# Patient Record
Sex: Female | Born: 1945 | Race: Black or African American | Hispanic: No | Smoking: Never smoker
Health system: Southern US, Community
[De-identification: ages and names within clinical notes are randomized; demographics above are authoritative.]

## PROBLEM LIST (undated history)

## (undated) ENCOUNTER — Emergency Department: Admission: EM | Payer: Medicare Other | Source: Home / Self Care

## (undated) DIAGNOSIS — I10 Essential (primary) hypertension: Secondary | ICD-10-CM

## (undated) HISTORY — PX: ABDOMINAL HYSTERECTOMY: SHX81

---

## 2008-01-23 ENCOUNTER — Ambulatory Visit: Payer: Self-pay

## 2010-08-20 IMAGING — CR DG KNEE COMPLETE 4+V*L*
1 series · 4 of 4 positions shown · non-contrast
Comparison: none

REASON FOR EXAM: Contusion CALL RESULTS STAT TO [phone]
COMMENTS:

PROCEDURE:     DXR - DXR KNEE LT COMP WITH OBLIQUES  - January 23, 2008  [DATE]
RESULT:     No fracture or dislocation is seen. Degenerative spurring is
noted at the knee medially and laterally. The knee joint space is
symmetrical. The patella is intact.

[Series 1: view not recorded · 0.17mm/px · 4 of 4 slices shown]
[im 1/4]
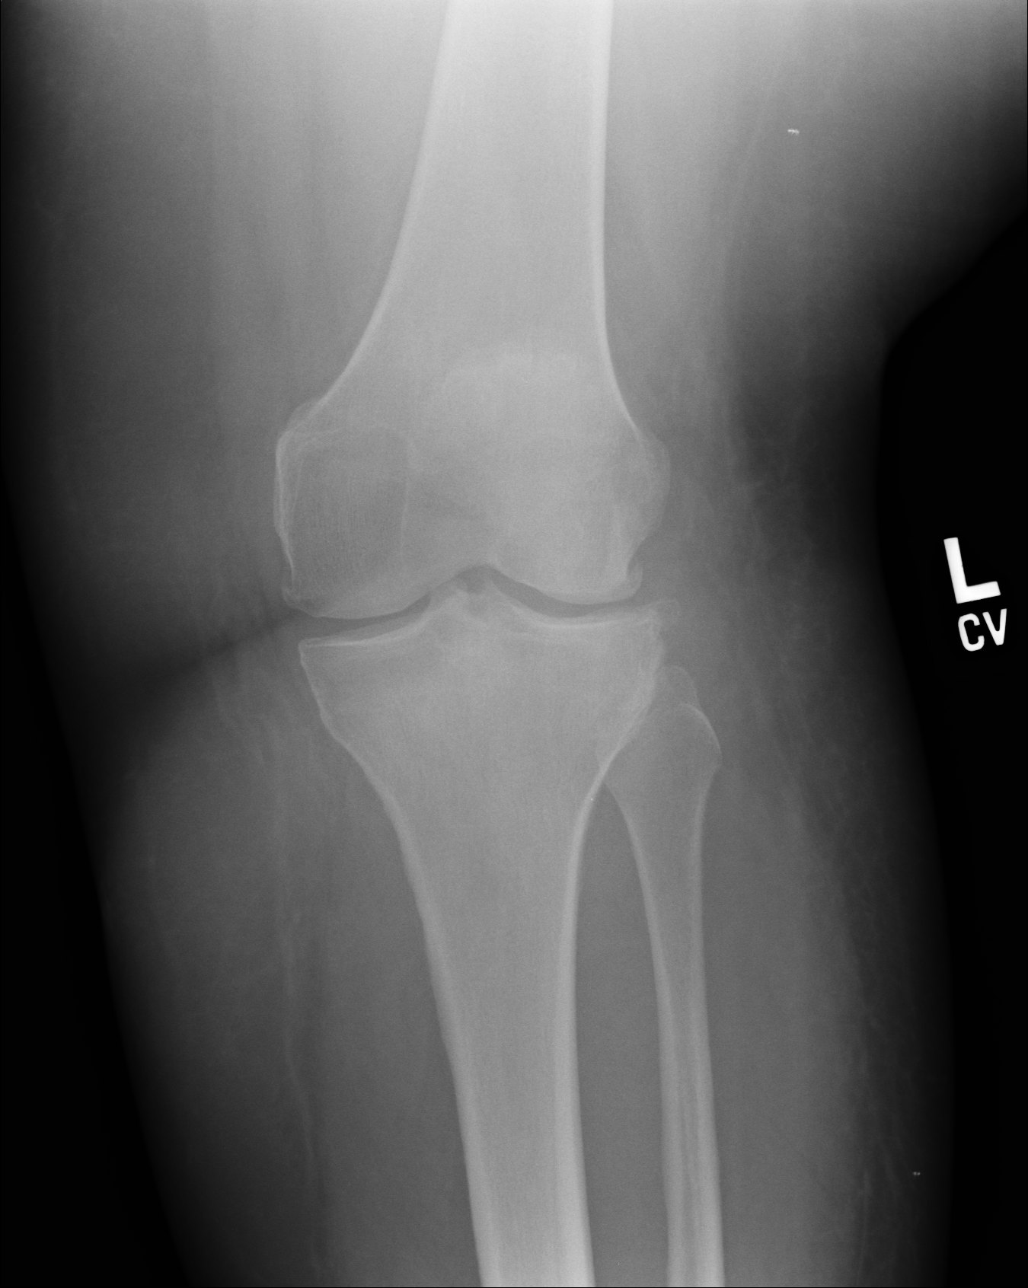
[im 2/4]
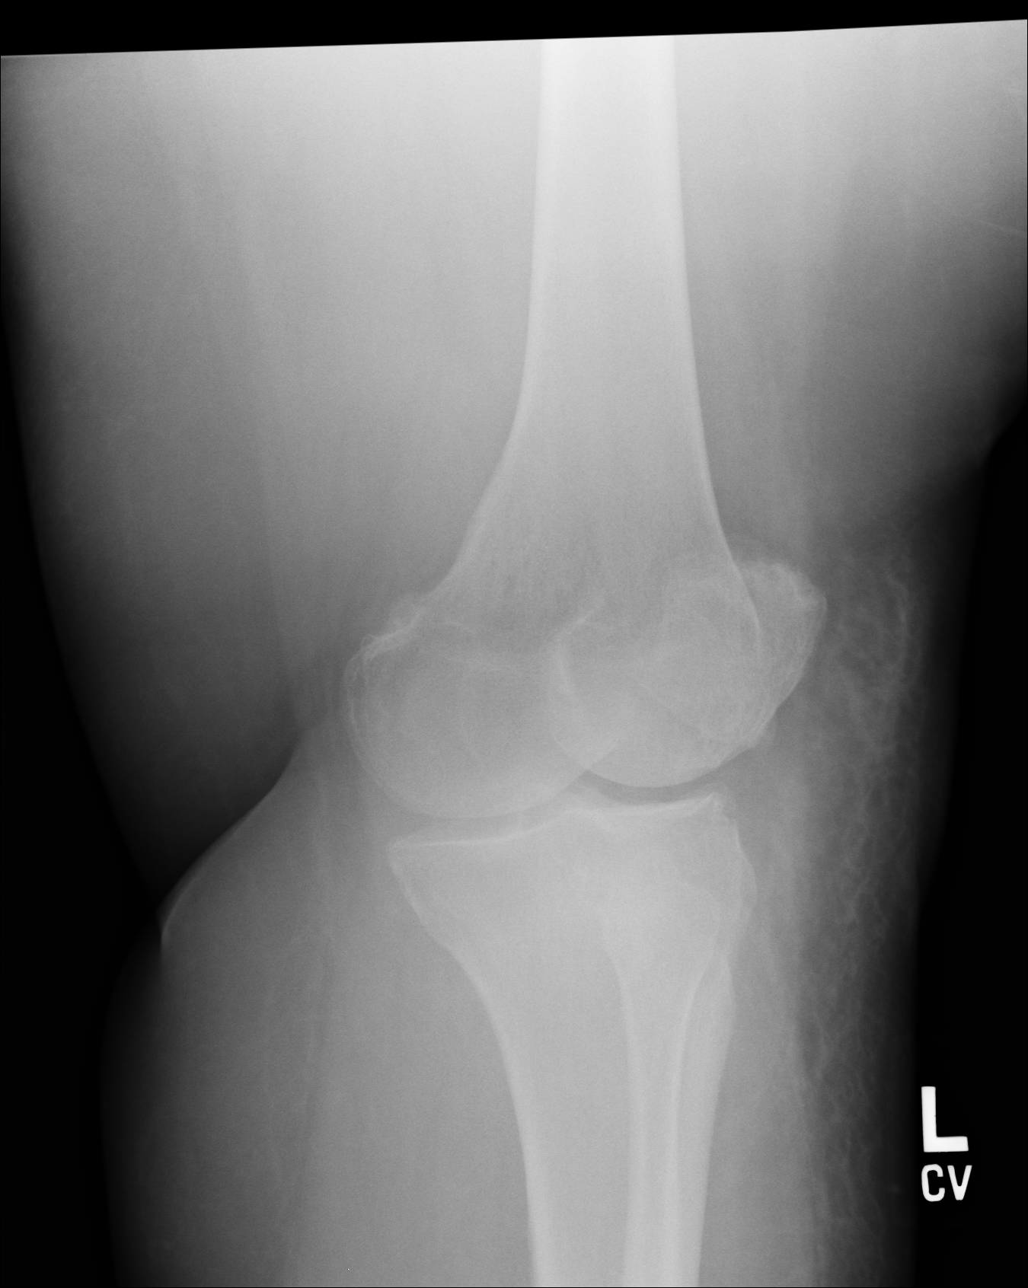
[im 3/4]
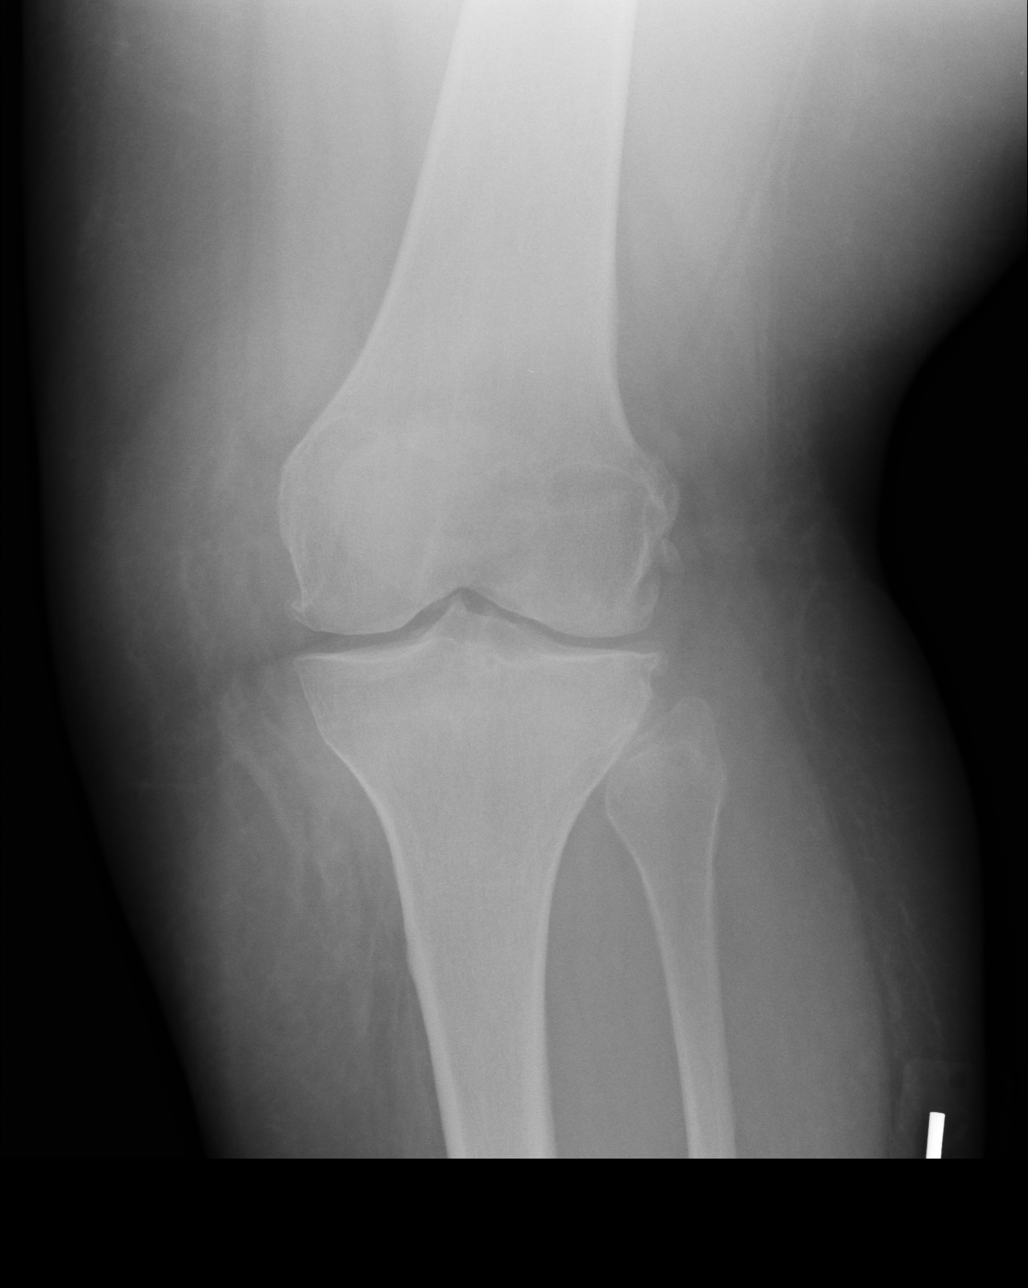
[im 4/4]
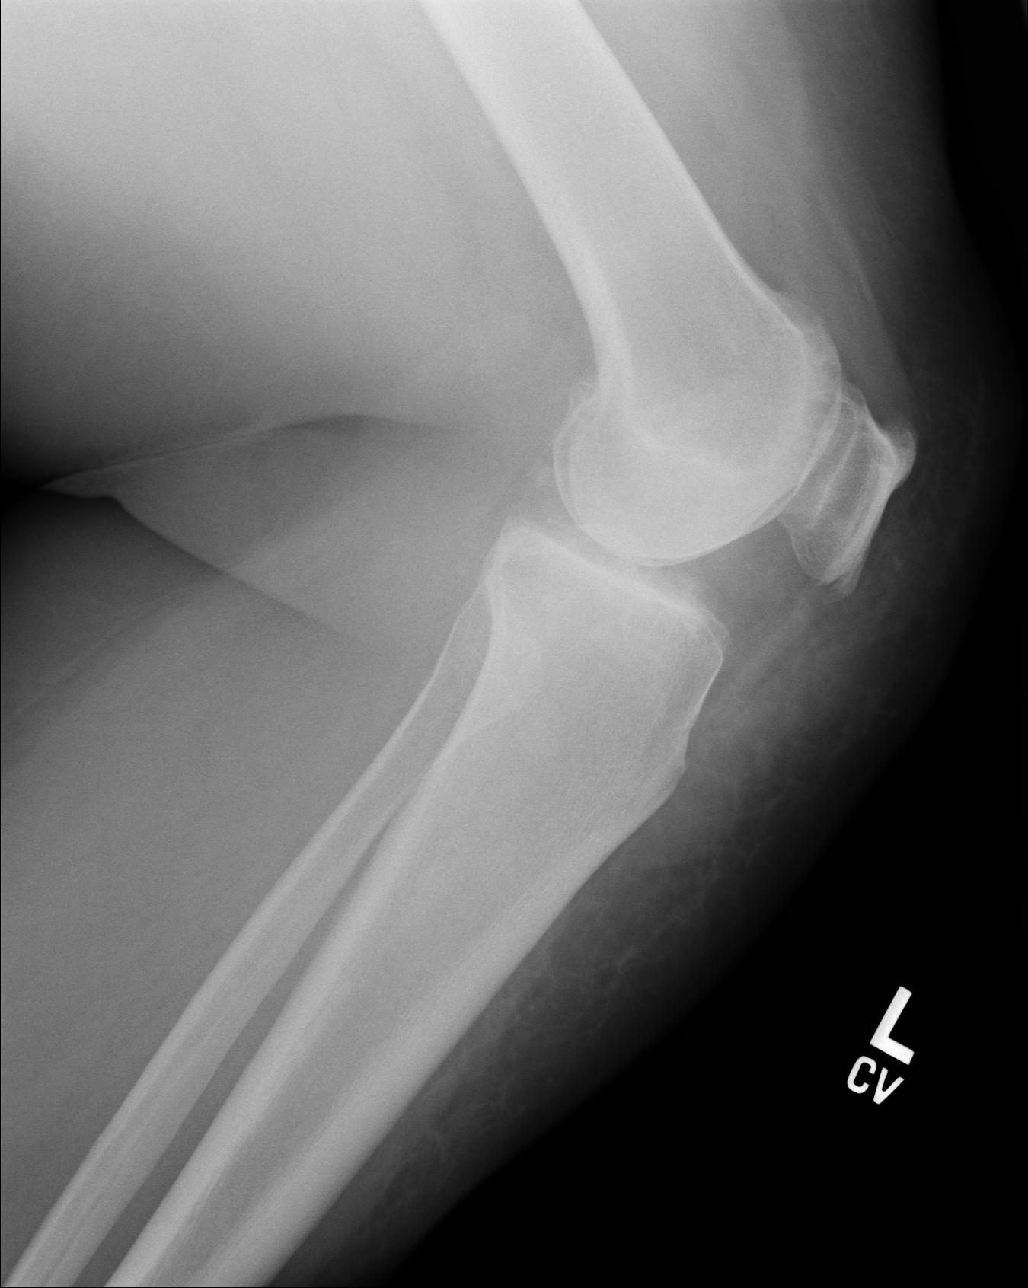

[4 of 4 positions shown; findings below may reference images not displayed]

IMPRESSION: 1. Arthritic changes are noted about the knee.
2. No fracture or other acute change is identified.

## 2012-10-21 ENCOUNTER — Emergency Department: Payer: Self-pay | Admitting: Emergency Medicine

## 2020-02-04 ENCOUNTER — Emergency Department: Payer: Medicare Other

## 2020-02-04 ENCOUNTER — Encounter: Payer: Self-pay | Admitting: Radiology

## 2020-02-04 ENCOUNTER — Emergency Department
Admission: EM | Admit: 2020-02-04 | Discharge: 2020-02-04 | Disposition: A | Discharge status: Home / Self Care | Payer: Medicare Other | Attending: Emergency Medicine | Admitting: Emergency Medicine

## 2020-02-04 DIAGNOSIS — R509 Fever, unspecified: Secondary | ICD-10-CM | POA: Diagnosis present

## 2020-02-04 DIAGNOSIS — G309 Alzheimer's disease, unspecified: Secondary | ICD-10-CM | POA: Diagnosis not present

## 2020-02-04 DIAGNOSIS — Z5321 Procedure and treatment not carried out due to patient leaving prior to being seen by health care provider: Secondary | ICD-10-CM | POA: Insufficient documentation

## 2020-02-04 LAB — COMPREHENSIVE METABOLIC PANEL
ALT: 12 U/L (ref 0–44)
AST: 11 U/L — ABNORMAL LOW (ref 15–41)
Albumin: 3.9 g/dL (ref 3.5–5.0)
Alkaline Phosphatase: 54 U/L (ref 38–126)
Anion gap: 9 (ref 5–15)
BUN: 12 mg/dL (ref 8–23)
CO2: 26 mmol/L (ref 22–32)
Calcium: 9.1 mg/dL (ref 8.9–10.3)
Chloride: 101 mmol/L (ref 98–111)
Creatinine, Ser: 0.91 mg/dL (ref 0.44–1.00)
GFR, Estimated: >60 mL/min (ref >60)
Glucose, Bld: 116 mg/dL — ABNORMAL HIGH (ref 70–99)
Potassium: 3.3 mmol/L — ABNORMAL LOW (ref 3.5–5.1)
Sodium: 136 mmol/L (ref 135–145)
Total Bilirubin: 0.6 mg/dL (ref 0.3–1.2)
Total Protein: 6.9 g/dL (ref 6.5–8.1)

## 2020-02-04 LAB — CBC WITH DIFFERENTIAL/PLATELET
Abs Immature Granulocytes: 0.01 10*3/uL (ref 0.00–0.07)
Basophils Absolute: 0 10*3/uL (ref 0.0–0.1)
Basophils Relative: 0 %
Eosinophils Absolute: 0 10*3/uL (ref 0.0–0.5)
Eosinophils Relative: 1 %
HCT: 35.4 % — ABNORMAL LOW (ref 36.0–46.0)
Hemoglobin: 11.6 g/dL — ABNORMAL LOW (ref 12.0–15.0)
Immature Granulocytes: 0 %
Lymphocytes Relative: 14 %
Lymphs Abs: 0.6 10*3/uL — ABNORMAL LOW (ref 0.7–4.0)
MCH: 30.4 pg (ref 26.0–34.0)
MCHC: 32.8 g/dL (ref 30.0–36.0)
MCV: 92.9 fL (ref 80.0–100.0)
Monocytes Absolute: 0.3 10*3/uL (ref 0.1–1.0)
Monocytes Relative: 9 %
Neutro Abs: 3.1 10*3/uL (ref 1.7–7.7)
Neutrophils Relative %: 76 %
Platelets: 223 10*3/uL (ref 150–400)
RBC: 3.81 MIL/uL — ABNORMAL LOW (ref 3.87–5.11)
RDW: 12.3 % (ref 11.5–15.5)
WBC: 4 10*3/uL (ref 4.0–10.5)
nRBC: 0 % (ref 0.0–0.2)

## 2020-02-04 LAB — URINALYSIS, COMPLETE (UACMP) WITH MICROSCOPIC
Bilirubin Urine: NEGATIVE
Glucose, UA: NEGATIVE mg/dL
Ketones, ur: NEGATIVE mg/dL
Nitrite: NEGATIVE
Protein, ur: NEGATIVE mg/dL
Specific Gravity, Urine: 1.023 (ref 1.005–1.030)
pH: 6 (ref 5.0–8.0)

## 2020-02-04 LAB — TROPONIN I (HIGH SENSITIVITY)
Troponin I (High Sensitivity): 3 ng/L (ref <18)
Troponin I (High Sensitivity): 4 ng/L (ref <18)

## 2020-02-04 LAB — LACTIC ACID, PLASMA: Lactic Acid, Venous: 1 mmol/L (ref 0.5–1.9)

## 2020-02-04 NOTE — ED Notes (Signed)
Pt has been called multiple times for VS assessment no answer, not visualized in lobby or outside.

## 2020-02-04 NOTE — ED Triage Notes (Signed)
Pt called for lab draw and VS reassessment, no response

## 2020-02-04 NOTE — ED Notes (Signed)
Called for vital signs update x 1, no answer, pt not visualized in the lobby

## 2020-02-04 NOTE — ED Notes (Signed)
Pt called for repeat vital signs, not in lobby

## 2020-02-04 NOTE — ED Triage Notes (Addendum)
Pt to ED due to fever at home of 100 F and pt sts, "I just didn't feel right." When asked to elaborate, pt sts, "It was just something telling me to come here." Pt recently diagnosed with early alzheimer. Pt poor historian. Pt denies taking OTC medication at home for fever.

## 2020-02-09 LAB — CULTURE, BLOOD (ROUTINE X 2)
Culture: NO GROWTH
Special Requests: ADEQUATE

## 2020-02-21 ENCOUNTER — Encounter: Payer: Self-pay | Admitting: Emergency Medicine

## 2020-02-21 ENCOUNTER — Emergency Department
Admission: EM | Admit: 2020-02-21 | Discharge: 2020-02-21 | Disposition: A | Discharge status: Home / Self Care | Payer: Medicare Other | Attending: Emergency Medicine | Admitting: Emergency Medicine

## 2020-02-21 ENCOUNTER — Other Ambulatory Visit: Payer: Self-pay

## 2020-02-21 DIAGNOSIS — J45909 Unspecified asthma, uncomplicated: Secondary | ICD-10-CM | POA: Diagnosis not present

## 2020-02-21 DIAGNOSIS — I10 Essential (primary) hypertension: Secondary | ICD-10-CM | POA: Diagnosis not present

## 2020-02-21 DIAGNOSIS — R42 Dizziness and giddiness: Secondary | ICD-10-CM | POA: Insufficient documentation

## 2020-02-21 DIAGNOSIS — G309 Alzheimer's disease, unspecified: Secondary | ICD-10-CM | POA: Insufficient documentation

## 2020-02-21 DIAGNOSIS — R55 Syncope and collapse: Secondary | ICD-10-CM | POA: Insufficient documentation

## 2020-02-21 HISTORY — DX: Essential (primary) hypertension: I10

## 2020-02-21 LAB — CBC
HCT: 37.5 % (ref 36.0–46.0)
Hemoglobin: 12.3 g/dL (ref 12.0–15.0)
MCH: 29.8 pg (ref 26.0–34.0)
MCHC: 32.8 g/dL (ref 30.0–36.0)
MCV: 90.8 fL (ref 80.0–100.0)
Platelets: 299 10*3/uL (ref 150–400)
RBC: 4.13 MIL/uL (ref 3.87–5.11)
RDW: 12 % (ref 11.5–15.5)
WBC: 4.2 10*3/uL (ref 4.0–10.5)
nRBC: 0 % (ref 0.0–0.2)

## 2020-02-21 LAB — URINALYSIS, COMPLETE (UACMP) WITH MICROSCOPIC
Bilirubin Urine: NEGATIVE
Glucose, UA: NEGATIVE mg/dL
Ketones, ur: NEGATIVE mg/dL
Leukocytes,Ua: NEGATIVE
Nitrite: NEGATIVE
Protein, ur: NEGATIVE mg/dL
Specific Gravity, Urine: 1.015 (ref 1.005–1.030)
pH: 7 (ref 5.0–8.0)

## 2020-02-21 LAB — BASIC METABOLIC PANEL
Anion gap: 10 (ref 5–15)
BUN: 13 mg/dL (ref 8–23)
CO2: 28 mmol/L (ref 22–32)
Calcium: 9.4 mg/dL (ref 8.9–10.3)
Chloride: 101 mmol/L (ref 98–111)
Creatinine, Ser: 1 mg/dL (ref 0.44–1.00)
GFR, Estimated: 59 mL/min — ABNORMAL LOW (ref >60)
Glucose, Bld: 98 mg/dL (ref 70–99)
Potassium: 3.2 mmol/L — ABNORMAL LOW (ref 3.5–5.1)
Sodium: 139 mmol/L (ref 135–145)

## 2020-02-21 LAB — TROPONIN I (HIGH SENSITIVITY): Troponin I (High Sensitivity): 5 ng/L (ref <18)

## 2020-02-21 MED ORDER — POTASSIUM CHLORIDE CRYS ER 20 MEQ PO TBCR
40.0000 meq | EXTENDED_RELEASE_TABLET | Freq: Once | ORAL | Status: AC
  Administered 2020-02-21: 40 meq via ORAL
  Filled 2020-02-21: qty 2

## 2020-02-21 NOTE — ED Provider Notes (Signed)
[facility] Emergency Department Provider Note   ____________________________________________   Event Date/Time   First MD Initiated Contact with Patient 02/21/20 1210     (approximate)  I have reviewed the triage vital signs and the nursing notes.   HISTORY  Chief Complaint Near Syncope    HPI Audrey Salinas is a 75 y.o. female with possible history of hypertension, asthma, IBS, and Alzheimer's dementia who presents to the ED complaining of lightheadedness.  Patient reports that after waking up this morning she was feeling very lightheaded and like she might pass out.  She states that she "felt like something was off" after waking up this morning, but she denies any chest pain, shortness of breath, vision changes, numbness, or weakness.  She reports being told by her primary care doctor that she needs to quarantine, but she does not remember being tested for COVID-19.  She denies any fevers, cough, vomiting, or diarrhea.  She now states she feels back to normal and just wanted to get checked out to make sure everything was okay.  Reviewing patient's chart, she does have a history of recently diagnosed dementia, tested positive for COVID-19 on January 9.        Past Medical History:  Diagnosis Date  . Hypertension     There are no problems to display for this patient.   Past Surgical History:  Procedure Laterality Date  . ABDOMINAL HYSTERECTOMY      Prior to Admission medications   Not on File    Allergies Patient has no known allergies.  History reviewed. No pertinent family history.  Social History Social History   Tobacco Use  . Smoking status: Never Smoker  . Smokeless tobacco: Never Used  Substance Use Topics  . Alcohol use: Not Currently    Review of Systems  Constitutional: No fever/chills Eyes: No visual changes. ENT: No sore throat. Cardiovascular: Denies chest pain.  Positive for lightheadedness and near  syncope. Respiratory: Denies shortness of breath. Gastrointestinal: No abdominal pain.  No nausea, no vomiting.  No diarrhea.  No constipation. Genitourinary: Negative for dysuria. Musculoskeletal: Negative for back pain. Skin: Negative for rash. Neurological: Negative for headaches, focal weakness or numbness.  ____________________________________________   PHYSICAL EXAM:  VITAL SIGNS: ED Triage Vitals  Enc Vitals Group     BP 02/21/20 1022 129/76     Pulse Rate 02/21/20 1022 98     Resp 02/21/20 1022 18     Temp 02/21/20 1022 98.4 F (36.9 C)     Temp Source 02/21/20 1022 Oral     SpO2 02/21/20 1022 99 %     Weight 02/21/20 1024 270 lb (122.5 kg)     Height 02/21/20 1024 5' 5.5" (1.664 m)     Head Circumference --      Peak Flow --      Pain Score 02/21/20 1027 0     Pain Loc --      Pain Edu? --      Excl. in [place]? --     Constitutional: Alert and oriented. Eyes: Conjunctivae are normal. Head: Atraumatic. Nose: No congestion/rhinnorhea. Mouth/Throat: Mucous membranes are moist. Neck: Normal ROM Cardiovascular: Normal rate, regular rhythm. Grossly normal heart sounds.  2+ radial pulses bilaterally. Respiratory: Normal respiratory effort.  No retractions. Lungs CTAB. Gastrointestinal: Soft and nontender. No distention. Genitourinary: deferred Musculoskeletal: No lower extremity tenderness nor edema. Neurologic:  Normal speech and language. No gross focal neurologic deficits are appreciated. Skin:  Skin is warm,  dry and intact. No rash noted. Psychiatric: Mood and affect are normal. Speech and behavior are normal.  ____________________________________________   LABS (all labs ordered are listed, but only abnormal results are displayed)  Labs Reviewed  BASIC METABOLIC PANEL - Abnormal; Notable for the following components:      Result Value   Potassium 3.2 (*)    GFR, Estimated 59 (*)    All other components within normal limits  URINALYSIS, COMPLETE (UACMP)  WITH MICROSCOPIC - Abnormal; Notable for the following components:   Color, Urine YELLOW (*)    APPearance CLEAR (*)    Hgb urine dipstick SMALL (*)    Bacteria, UA RARE (*)    All other components within normal limits  CBC  TROPONIN I (HIGH SENSITIVITY)   ____________________________________________  EKG  ED ECG REPORT I, Chesley Noon, the attending physician, personally viewed and interpreted this ECG.   Date: 02/21/2020  EKG Time: 10:29  Rate: 86  Rhythm: normal sinus rhythm  Axis: Normal  Intervals:none  ST&T Change: None   PROCEDURES  Procedure(s) performed (including Critical Care):  Procedures   ____________________________________________   INITIAL IMPRESSION / ASSESSMENT AND PLAN / ED COURSE       75 year old female with past medical history of hypertension, asthma, IBS, and Alzheimer dementia who presents to the ED for episode of lightheadedness after waking up this morning.  All symptoms now have resolved and patient denies any chest pain or shortness of breath.  EKG shows no evidence of arrhythmia or ischemia, labs remarkable only for mild hypokalemia, which we will replete.  She recently tested positive for COVID-19 but denies any difficulty breathing and is maintaining O2 sats on room air.  Reviewing patient's chart, she has had recurrent visits for similar lightheadedness, was evaluated for this by cardiology with essentially normal echocardiogram in December.  She is appropriate for discharge home with PCP and cardiology follow-up, was counseled to return to the ED for new or worsening symptoms.  Patient agrees with plan.      ____________________________________________   FINAL CLINICAL IMPRESSION(S) / ED DIAGNOSES  Final diagnoses:  Lightheadedness  Near syncope     ED Discharge Orders    None       Note:  This document was prepared using Dragon voice recognition software and may include unintentional dictation errors.   Chesley Noon, MD 02/21/20 [id number]

## 2020-02-21 NOTE — ED Triage Notes (Signed)
Pt comes into the ED via POV c/o near syncopal episode after standing up to get to the bathroom.  Pt states she does take BP medication.  Pt denies any CP, SHOB, N/V, but does have some dizziness right before the near syncopal episode.  Pt in NAD at this time and is neurologically intact.

## 2020-02-21 NOTE — ED Notes (Signed)
ED Provider at bedside. 

## 2020-04-25 ENCOUNTER — Emergency Department
Admission: EM | Admit: 2020-04-25 | Discharge: 2020-04-25 | Disposition: A | Discharge status: Home / Self Care | Payer: Medicare Other | Attending: Emergency Medicine | Admitting: Emergency Medicine

## 2020-04-25 ENCOUNTER — Other Ambulatory Visit: Payer: Self-pay

## 2020-04-25 DIAGNOSIS — R55 Syncope and collapse: Secondary | ICD-10-CM | POA: Diagnosis not present

## 2020-04-25 DIAGNOSIS — N390 Urinary tract infection, site not specified: Secondary | ICD-10-CM | POA: Diagnosis not present

## 2020-04-25 DIAGNOSIS — I10 Essential (primary) hypertension: Secondary | ICD-10-CM | POA: Diagnosis not present

## 2020-04-25 DIAGNOSIS — R42 Dizziness and giddiness: Secondary | ICD-10-CM | POA: Diagnosis present

## 2020-04-25 LAB — URINALYSIS, COMPLETE (UACMP) WITH MICROSCOPIC
Bilirubin Urine: NEGATIVE
Glucose, UA: NEGATIVE mg/dL
Ketones, ur: NEGATIVE mg/dL
Nitrite: NEGATIVE
Protein, ur: NEGATIVE mg/dL
Specific Gravity, Urine: 1.002 — ABNORMAL LOW (ref 1.005–1.030)
pH: 7 (ref 5.0–8.0)

## 2020-04-25 LAB — CBC
HCT: 35.6 % — ABNORMAL LOW (ref 36.0–46.0)
Hemoglobin: 11.7 g/dL — ABNORMAL LOW (ref 12.0–15.0)
MCH: 30 pg (ref 26.0–34.0)
MCHC: 32.9 g/dL (ref 30.0–36.0)
MCV: 91.3 fL (ref 80.0–100.0)
Platelets: 253 10*3/uL (ref 150–400)
RBC: 3.9 MIL/uL (ref 3.87–5.11)
RDW: 12.3 % (ref 11.5–15.5)
WBC: 4.1 10*3/uL (ref 4.0–10.5)
nRBC: 0 % (ref 0.0–0.2)

## 2020-04-25 LAB — BASIC METABOLIC PANEL
Anion gap: 9 (ref 5–15)
BUN: 10 mg/dL (ref 8–23)
CO2: 26 mmol/L (ref 22–32)
Calcium: 9.4 mg/dL (ref 8.9–10.3)
Chloride: 102 mmol/L (ref 98–111)
Creatinine, Ser: 0.82 mg/dL (ref 0.44–1.00)
GFR, Estimated: >60 mL/min (ref >60)
Glucose, Bld: 113 mg/dL — ABNORMAL HIGH (ref 70–99)
Potassium: 4 mmol/L (ref 3.5–5.1)
Sodium: 137 mmol/L (ref 135–145)

## 2020-04-25 LAB — TROPONIN I (HIGH SENSITIVITY)
Troponin I (High Sensitivity): 3 ng/L (ref <18)
Troponin I (High Sensitivity): 3 ng/L (ref <18)

## 2020-04-25 MED ORDER — CEPHALEXIN 500 MG PO CAPS: 500.0000 mg | ORAL_CAPSULE | Freq: Four times a day (QID) | ORAL | 0 refills | Status: AC

## 2020-04-25 MED ORDER — CEPHALEXIN 500 MG PO CAPS
500.0000 mg | ORAL_CAPSULE | Freq: Once | ORAL | Status: AC
  Administered 2020-04-25: 500 mg via ORAL
  Filled 2020-04-25: qty 1

## 2020-04-25 NOTE — ED Provider Notes (Signed)
[facility] Emergency Department Provider Note   ____________________________________________   I have reviewed the triage vital signs and the nursing notes.   HISTORY  Chief Complaint Near syncope  History limited by: Not Limited   HPI Audrey Salinas is a 75 y.o. female who presents to the emergency department today because of concern for near syncopal episode. The patient states that it happened this morning shortly after she took a medication to help with her memory. She is not sure what this medication is called. She stated she started feeling lightheaded and dizzy. The patient also felt like her body wanted to jerk. At the time of my exam she still has some lingering symptoms but overall feels improved. States she was in her normal state of health yesterday.    Records reviewed. Per medical record review patient has a history of dementia. ER visit roughly 2 months ago because of concern for near syncope.   Past Medical History:  Diagnosis Date  . Hypertension     There are no problems to display for this patient.   Past Surgical History:  Procedure Laterality Date  . ABDOMINAL HYSTERECTOMY      Prior to Admission medications   Not on File    Allergies Patient has no known allergies.  No family history on file.  Social History Social History   Tobacco Use  . Smoking status: Never Smoker  . Smokeless tobacco: Never Used  Substance Use Topics  . Alcohol use: Not Currently    Review of Systems Constitutional: No fever/chills Eyes: No visual changes. ENT: No sore throat. Cardiovascular: Denies chest pain. Respiratory: Denies shortness of breath. Gastrointestinal: No abdominal pain.  No nausea, no vomiting.  No diarrhea.   Genitourinary: Negative for dysuria. Musculoskeletal: Negative for back pain. Skin: Negative for rash. Neurological: Positive for lightheadedness ____________________________________________   PHYSICAL  EXAM:  VITAL SIGNS: ED Triage Vitals  Enc Vitals Group     BP 04/25/20 1716 (!) 172/82     Pulse Rate 04/25/20 1716 79     Resp 04/25/20 1716 18     Temp 04/25/20 1716 98.7 F (37.1 C)     Temp src --      SpO2 04/25/20 1716 100 %     Weight 04/25/20 1717 250 lb (113.4 kg)     Height 04/25/20 1717 5\' 6"  (1.676 m)     Head Circumference --      Peak Flow --      Pain Score 04/25/20 1717 0   Constitutional: Alert and oriented.  Eyes: Conjunctivae are normal.  ENT      Head: Normocephalic and atraumatic.      Nose: No congestion/rhinnorhea.      Mouth/Throat: Mucous membranes are moist.      Neck: No stridor. Hematological/Lymphatic/Immunilogical: No cervical lymphadenopathy. Cardiovascular: Normal rate, regular rhythm.  No murmurs, rubs, or gallops.  Respiratory: Normal respiratory effort without tachypnea nor retractions. Breath sounds are clear and equal bilaterally. No wheezes/rales/rhonchi. Gastrointestinal: Soft and non tender. No rebound. No guarding.  Genitourinary: Deferred Musculoskeletal: Normal range of motion in all extremities. No lower extremity edema. Neurologic:  Normal speech and language. No gross focal neurologic deficits are appreciated.  Skin:  Skin is warm, dry and intact. No rash noted. Psychiatric: Mood and affect are normal. Speech and behavior are normal. Patient exhibits appropriate insight and judgment.  ____________________________________________    LABS (pertinent positives/negatives)  CBC wbc 4.1, hgb 11.7, plt 253 BMP wnl except glu 113  UA clear, small hgb dipstick, trace leukocytes, 6-10 wbc, rare bacteria  ____________________________________________   EKG  I, Phineas Semen, attending physician, personally viewed and interpreted this EKG  EKG Time: 1723 Rate: 73 Rhythm: normal sinus rhythm Axis: normal Intervals: qtc 427 QRS: narrow ST changes: no st elevation Impression: normal  ekg   ____________________________________________    RADIOLOGY  None  ____________________________________________   PROCEDURES  Procedures  ____________________________________________   INITIAL IMPRESSION / ASSESSMENT AND PLAN / ED COURSE  Pertinent labs & imaging results that were available during my care of the patient were reviewed by me and considered in my medical decision making (see chart for details).   Patient presented to the emergency department today because of concerns for a near syncopal episode.  Patient had 2 sets of troponins drawn both which were negative.  At this point I doubt ACS.  Patient's EKG without any concerning arrhythmia.  Urine was concerning for urinary tract infection.  I do wonder if this was playing a role in the patient's symptoms.  Patient will be given dose of antibiotics here and prescription for further antibiotics.  ___________________________________________   FINAL CLINICAL IMPRESSION(S) / ED DIAGNOSES  Final diagnoses:  Near syncope  Lower urinary tract infectious disease     Note: This dictation was prepared with Dragon dictation. Any transcriptional errors that result from this process are unintentional     Phineas Semen, MD 04/25/20 2122

## 2020-04-25 NOTE — ED Notes (Signed)
Pt presents to ED with c/o of having "feelings not right'. Pt states she started taking a medication for "memory". Pt states was not RX'ed but bought over the counter at [facility]. Pt states she cannot recall the name of the medication. Pt denies N/V/D. Pt denies chest pain. Pt is ambulating with a steady gait at this time. Pt denies urinary symptoms. Pt is A&Ox4. NAD noted.

## 2020-04-25 NOTE — ED Triage Notes (Signed)
Pt states coming in with tremors. Pt states she started to take a new medication for her memory and believes it is causing her tremors. Pt does not recall the name of the medication. Pt states she felt like she was going to pass out, but did not. Pt states her urine is also clear.   Pt states taking a baby aspirin and bp medications every day as well. Pt denies pain

## 2020-04-25 NOTE — Discharge Instructions (Addendum)
Please seek medical attention for any high fevers, chest pain, shortness of breath, change in behavior, persistent vomiting, bloody stool or any other new or concerning symptoms.  

## 2020-04-25 NOTE — ED Notes (Signed)
Introduced self to patient and discussed plan of care. Patient in bed, breathing unlabored, speaking in full sentences and with symmetric chest rise and fall. Bed is low and locked with side rails raised x1, call bell in reach, and bp/pulse ox in place. Patient reports continued burning pain to thoracic spine without known injury. MD made aware of continued pain. Patient provided with warm blanket per request.

## 2020-04-25 NOTE — ED Notes (Signed)
MD notified pt requesting to speak with physician.

## 2020-04-28 LAB — URINE CULTURE: Culture: 40000 — AB

## 2022-04-10 ENCOUNTER — Ambulatory Visit
Admission: EM | Admit: 2022-04-10 | Discharge: 2022-04-10 | Disposition: A | Discharge status: Home / Self Care | Payer: Medicare Other | Attending: Family Medicine | Admitting: Family Medicine

## 2022-04-10 DIAGNOSIS — B3731 Acute candidiasis of vulva and vagina: Secondary | ICD-10-CM | POA: Diagnosis present

## 2022-04-10 DIAGNOSIS — N3001 Acute cystitis with hematuria: Secondary | ICD-10-CM | POA: Diagnosis not present

## 2022-04-10 DIAGNOSIS — N3000 Acute cystitis without hematuria: Secondary | ICD-10-CM | POA: Diagnosis not present

## 2022-04-10 LAB — URINALYSIS, W/ REFLEX TO CULTURE (INFECTION SUSPECTED)
Glucose, UA: NEGATIVE mg/dL
Nitrite: POSITIVE — AB
Specific Gravity, Urine: 1.015 (ref 1.005–1.030)
pH: 7 (ref 5.0–8.0)

## 2022-04-10 MED ORDER — CEPHALEXIN 500 MG PO CAPS: 500.0000 mg | ORAL_CAPSULE | Freq: Four times a day (QID) | ORAL | 0 refills | Status: DC

## 2022-04-10 MED ORDER — FLUCONAZOLE 150 MG PO TABS: 150.0000 mg | ORAL_TABLET | ORAL | 0 refills | Status: AC

## 2022-04-10 NOTE — ED Provider Notes (Signed)
[facility]    CSN: [id number] Arrival date & time: 04/10/22  1156      History   Chief Complaint Chief Complaint  Patient presents with   Dysuria     HPI HPI Audrey Salinas is a 77 y.o. female.    Audrey Salinas presents for burning with urination with urinary frequency and urgency for the past 1 month.  Tried something OTC prior to arrival but that didn't help.  Has  not had any antibiotics in last 30 days.   She is not sexually active.  Patient has had a hysterectomy.  - Abnormal vaginal discharge: unsure - Dysuria: yes - Hematuria: no - Urinary urgency: yes - Urinary frequency: yes  - Fever: no - Abdominal pain: lower  - Pelvic pain: no - Rash/Skin lesions/mouth ulcers: no - Nausea: no  - Vomiting: no  - Back Pain: not new  - Headache: no       Past Medical History:  Diagnosis Date   Hypertension     There are no problems to display for this patient.   Past Surgical History:  Procedure Laterality Date   ABDOMINAL HYSTERECTOMY      OB History   No obstetric history on file.      Home Medications    Prior to Admission medications   Medication Sig Start Date End Date Taking? Authorizing Provider  cephALEXin (KEFLEX) 500 MG capsule Take 1 capsule (500 mg total) by mouth 4 (four) times daily. 04/10/22  Yes Sharron Petruska, DO  fluconazole (DIFLUCAN) 150 MG tablet Take 1 tablet (150 mg total) by mouth every 3 (three) days for 2 doses. 04/10/22 04/14/22 Yes Briannah Lona, DO  lisinopril-hydrochlorothiazide (ZESTORETIC) 20-25 MG tablet Take 1 tablet by mouth daily. 01/24/21  Yes [provider]    Family History History reviewed. No pertinent family history.  Social History Social History   Tobacco Use   Smoking status: Never   Smokeless tobacco: Never  Substance Use Topics   Alcohol use: Not Currently   Drug use: Never     Allergies   Patient has no known allergies.   Review of Systems Review of Systems: :negative unless  otherwise stated in HPI.      Physical Exam Triage Vital Signs ED Triage Vitals  Enc Vitals Group     BP 04/10/22 1206 130/82     Pulse Rate 04/10/22 1206 86     Resp 04/10/22 1206 16     Temp 04/10/22 1206 98.1 F (36.7 C)     Temp Source 04/10/22 1206 Oral     SpO2 04/10/22 1206 98 %     Weight 04/10/22 1204 200 lb (90.7 kg)     Height 04/10/22 1204 '5\' 6"'$  (1.676 m)     Head Circumference --      Peak Flow --      Pain Score 04/10/22 1203 10     Pain Loc --      Pain Edu? --      Excl. in [place]? --    No data found.  Updated Vital Signs BP 130/82 (BP Location: Left Arm)   Pulse 86   Temp 98.1 F (36.7 C) (Oral)   Resp 16   Ht '5\' 6"'$  (1.676 m)   Wt 90.7 kg   SpO2 98%   BMI 32.28 kg/m   Visual Acuity Right Eye Distance:   Left Eye Distance:   Bilateral Distance:    Right Eye Near:   Left Eye Near:  Bilateral Near:     Physical Exam GEN: well appearing female in no acute distress  CVS: well perfused, regular rate   RESP: speaking in full sentences without pause  ABD: soft, non-tender, non-distended, no palpable masses, no CVA tenderness    UC Treatments / Results  Labs (all labs ordered are listed, but only abnormal results are displayed) Labs Reviewed  URINALYSIS, W/ REFLEX TO CULTURE (INFECTION SUSPECTED) - Abnormal; Notable for the following components:      Result Value   Color, Urine AMBER (*)    APPearance HAZY (*)    Hgb urine dipstick SMALL (*)    Bilirubin Urine SMALL (*)    Ketones, ur TRACE (*)    Protein, ur TRACE (*)    Nitrite POSITIVE (*)    Leukocytes,Ua SMALL (*)    Bacteria, UA MANY (*)    All other components within normal limits  URINE CULTURE    EKG   Radiology No results found.  Procedures Procedures (including critical care time)  Medications Ordered in UC Medications - No data to display  Initial Impression / Assessment and Plan / UC Course  I have reviewed the triage vital signs and the nursing  notes.  Pertinent labs & imaging results that were available during my care of the patient were reviewed by me and considered in my medical decision making (see chart for details).      Patient is a 77 y.o.Audrey Salinas female  who presents for 1 month of vaginal irritation and dysuria.  Overall patient is well-appearing and afebrile.  Vital signs stable.  UA consistent with acute cystitis with hematuria  supported on microscopy.  Treat with Keflex 4 times daily for 5 days.  Likely has Yeast vaginitis as well as yeast seen on microscopy.  - Treatment: Keflex as above and Diflucan for 2 doses for associated yeast infection  - Follow up urine culture   Return precautions including abdominal pain, fever, chills, nausea, or vomiting given. Discussed MDM, treatment plan and plan for follow-up with patient who agrees with plan.      Final Clinical Impressions(s) / UC Diagnoses   Final diagnoses:  Yeast vaginitis  Acute cystitis with hematuria     Discharge Instructions      You had evidence of of a bacterial infection in your urine and a yeast infection today.  Stop by the pharmacy to pick up your prescriptions.  For your UTI: Take Keflex 4 times a day for the next 5 days  For yeast infection: Take the first dose of Diflucan on day 3 and after you complete your antibiotics take the last dose.  If your symptoms do not improve in the next 7 days, be sure to follow-up here or at your primary care provider office.  Go to the emergency department if you are having increasing pain, worsening vaginal bleeding or fever.      ED Prescriptions     Medication Sig Dispense Auth. Provider   cephALEXin (KEFLEX) 500 MG capsule Take 1 capsule (500 mg total) by mouth 4 (four) times daily. 20 capsule Royann Wildasin, DO   fluconazole (DIFLUCAN) 150 MG tablet Take 1 tablet (150 mg total) by mouth every 3 (three) days for 2 doses. 2 tablet Lyndee Hensen, DO      PDMP not reviewed this encounter.    Lyndee Hensen, DO 04/10/22 1319

## 2022-04-10 NOTE — ED Triage Notes (Signed)
Pt c/o urinary frequency,burning, pain x4 days. Denies any hematuria.

## 2022-04-10 NOTE — Discharge Instructions (Addendum)
You had evidence of of a bacterial infection in your urine and a yeast infection today.  Stop by the pharmacy to pick up your prescriptions.  For your UTI: Take Keflex 4 times a day for the next 5 days  For yeast infection: Take the first dose of Diflucan on day 3 and after you complete your antibiotics take the last dose.  If your symptoms do not improve in the next 7 days, be sure to follow-up here or at your primary care provider office.  Go to the emergency department if you are having increasing pain, worsening vaginal bleeding or fever.

## 2022-04-13 LAB — URINE CULTURE: Culture: >=100000 — AB

## 2022-05-19 ENCOUNTER — Other Ambulatory Visit: Payer: Self-pay

## 2022-05-19 ENCOUNTER — Ambulatory Visit
Admission: EM | Admit: 2022-05-19 | Discharge: 2022-05-19 | Disposition: A | Discharge status: Home / Self Care | Payer: Medicare Other | Attending: Internal Medicine | Admitting: Internal Medicine

## 2022-05-19 DIAGNOSIS — R300 Dysuria: Secondary | ICD-10-CM

## 2022-05-19 MED ORDER — PHENAZOPYRIDINE HCL 200 MG PO TABS: 200.0000 mg | ORAL_TABLET | Freq: Three times a day (TID) | ORAL | 0 refills | Status: AC

## 2022-05-19 NOTE — Discharge Instructions (Addendum)
Since you are unable to give [place] enough urine, for the quick test here, we will order a culture and we should have the results in the morning. I have sent some medication for the burning in the mean time.

## 2022-05-19 NOTE — ED Triage Notes (Signed)
Vaginal burning and itching x 1 month

## 2022-05-19 NOTE — ED Provider Notes (Signed)
[facility]    CSN: [id number] Arrival date & time: 05/19/22  1611      History   Chief Complaint Chief Complaint  Patient presents with   Vaginal Itching    HPI Audrey Salinas is a 77 y.o. female who presents with vaginal itching and dysuria. Had a UTI last month and was placed on Keflex and Diflucan and was fine til 2 days ago. Denies fever, chills, sweats, flank pain.      Past Medical History:  Diagnosis Date   Hypertension     There are no problems to display for this patient.   Past Surgical History:  Procedure Laterality Date   ABDOMINAL HYSTERECTOMY      OB History   No obstetric history on file.      Home Medications    Prior to Admission medications   Medication Sig Start Date End Date Taking? Authorizing Provider  phenazopyridine (PYRIDIUM) 200 MG tablet Take 1 tablet (200 mg total) by mouth 3 (three) times daily. 05/19/22  Yes Rodriguez-Southworth, Nettie Elm, PA-C  lisinopril-hydrochlorothiazide (ZESTORETIC) 20-25 MG tablet Take 1 tablet by mouth daily. 01/24/21   [provider]    Family History History reviewed. No pertinent family history.  Social History Social History   Tobacco Use   Smoking status: Never   Smokeless tobacco: Never  Substance Use Topics   Alcohol use: Not Currently   Drug use: Never     Allergies   Patient has no known allergies.   Review of Systems Review of Systems As noted in HPI  Physical Exam Triage Vital Signs ED Triage Vitals  Enc Vitals Group     BP 05/19/22 1621 (!) 151/85     Pulse Rate 05/19/22 1621 85     Resp 05/19/22 1621 20     Temp 05/19/22 1621 98.5 F (36.9 C)     Temp src --      SpO2 05/19/22 1621 100 %     Weight --      Height --      Head Circumference --      Peak Flow --      Pain Score 05/19/22 1623 6     Pain Loc --      Pain Edu? --      Excl. in GC? --    No data found.  Updated Vital Signs BP (!) 151/85   Pulse 85   Temp 98.5 F (36.9 C)    Resp 20   SpO2 100%   Visual Acuity Right Eye Distance:   Left Eye Distance:   Bilateral Distance:    Right Eye Near:   Left Eye Near:    Bilateral Near:     Physical Exam Vitals and nursing note reviewed.  Constitutional:      General: She is not in acute distress.    Appearance: She is obese. She is not toxic-appearing.  Eyes:     General: No scleral icterus.    Conjunctiva/sclera: Conjunctivae normal.  Pulmonary:     Effort: Pulmonary effort is normal.  Musculoskeletal:     Cervical back: Neck supple.  Neurological:     Mental Status: She is alert and oriented to person, place, and time.     Gait: Gait normal.  Psychiatric:        Mood and Affect: Mood normal.        Behavior: Behavior normal.     UC Treatments / Results  Labs (all labs ordered are listed,  but only abnormal results are displayed) Labs Reviewed  URINE CULTURE  URINALYSIS, W/ REFLEX TO CULTURE (INFECTION SUSPECTED)    EKG   Radiology No results found.  Procedures Procedures (including critical care time)  Medications Ordered in UC Medications - No data to display  Initial Impression / Assessment and Plan / UC Course  I have reviewed the triage vital signs and the nursing notes.  Pertinent labs  results that were available during my care of the patient were reviewed by me and considered in my medical decision making (see chart for details).  She was not able to five [place] enough urine for UA, so we ordered a culture and in the mean time placed her on Pyridium. We will call her tomorrow when the culture is back.    Final Clinical Impressions(s) / UC Diagnoses   Final diagnoses:  Dysuria     Discharge Instructions      Since you are unable to give [place] enough urine, for the quick test here, we will order a culture and we should have the results in the morning. I have sent some medication for the burning in the mean time.      ED Prescriptions     Medication Sig Dispense Auth.  Provider   phenazopyridine (PYRIDIUM) 200 MG tablet Take 1 tablet (200 mg total) by mouth 3 (three) times daily. 6 tablet Rodriguez-Southworth, Nettie Elm, PA-C      PDMP not reviewed this encounter.   Garey Ham, PA-C 05/19/22 1821

## 2022-05-21 LAB — URINE CULTURE

## 2022-09-01 IMAGING — CR DG CHEST 2V
2 series · 2 of 2 positions shown · non-contrast
Comparison: None.

CLINICAL DATA: Sepsis

EXAM:
CHEST - 2 VIEW

[chest pa]
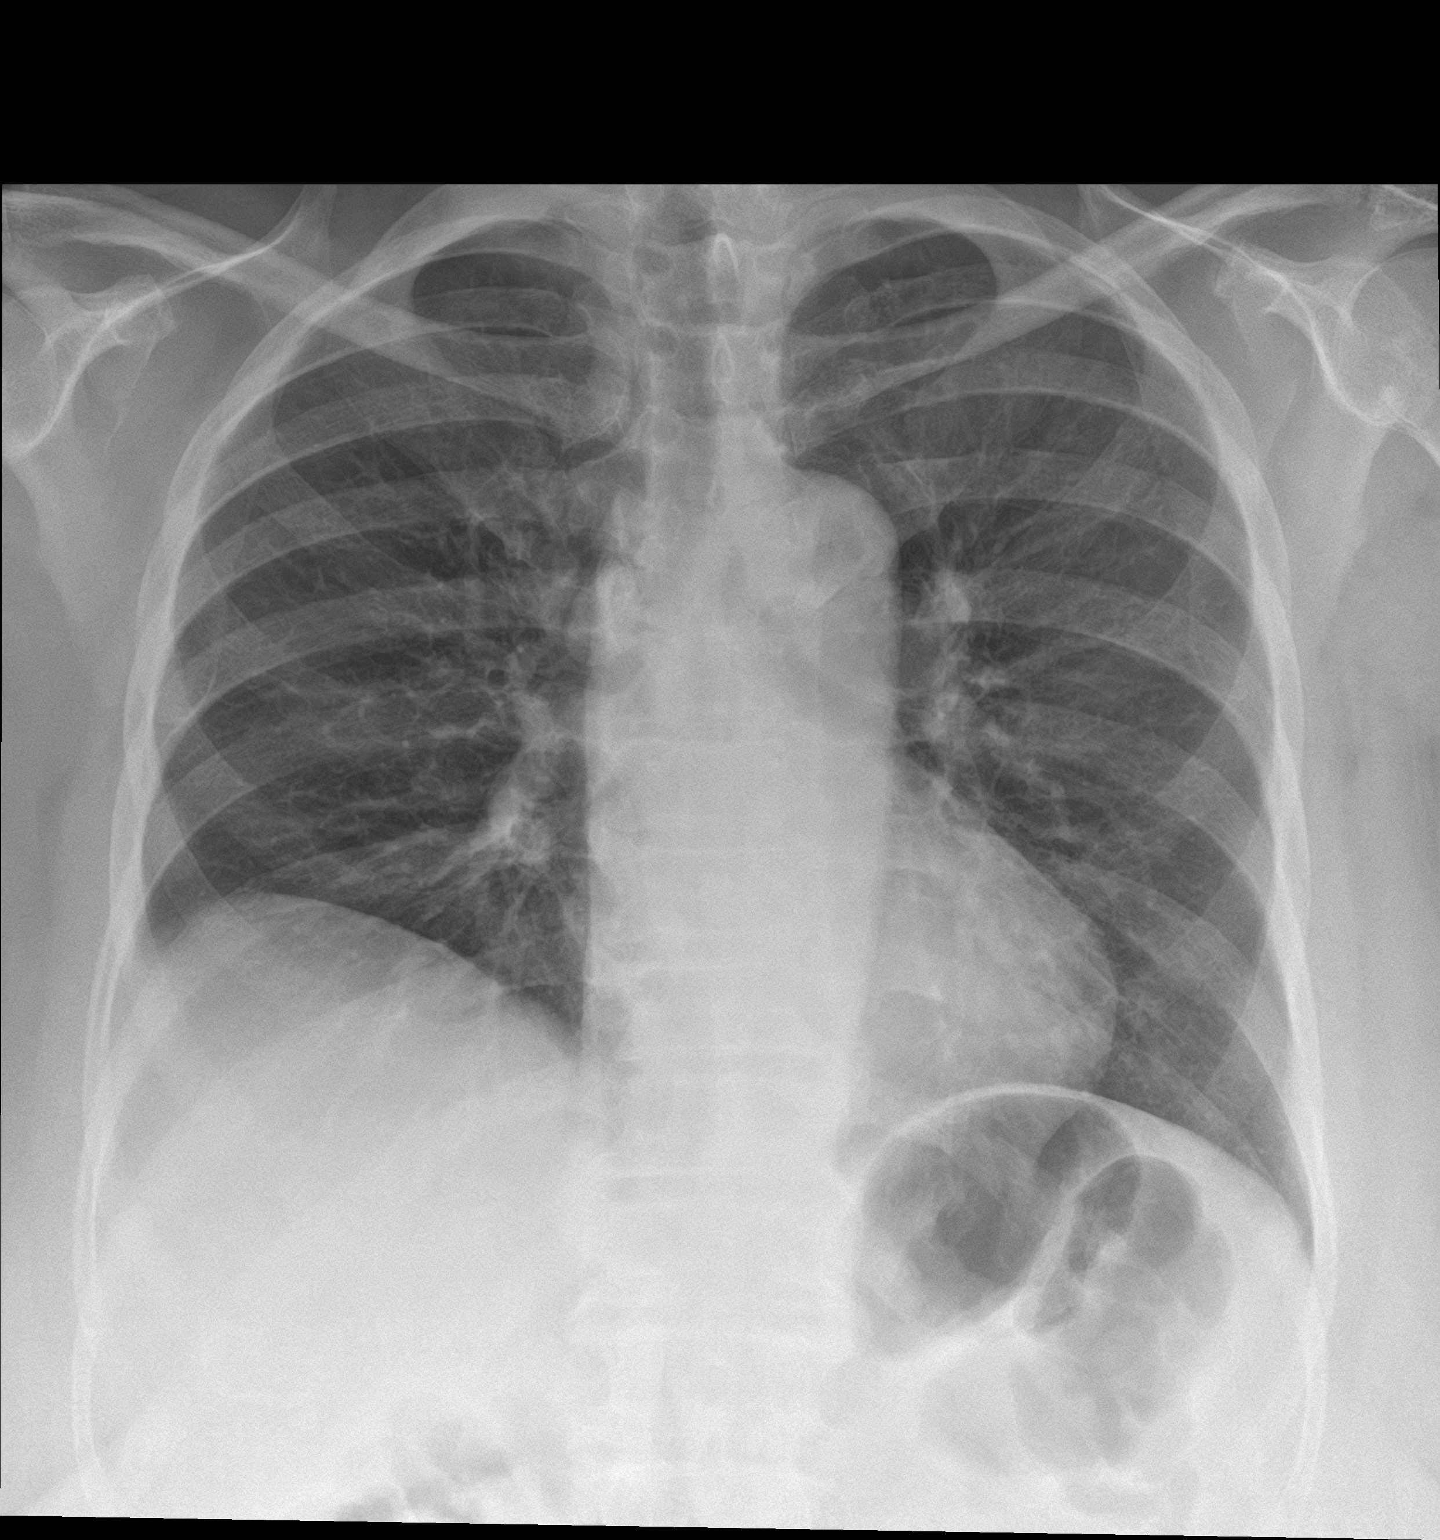

[chest lat]
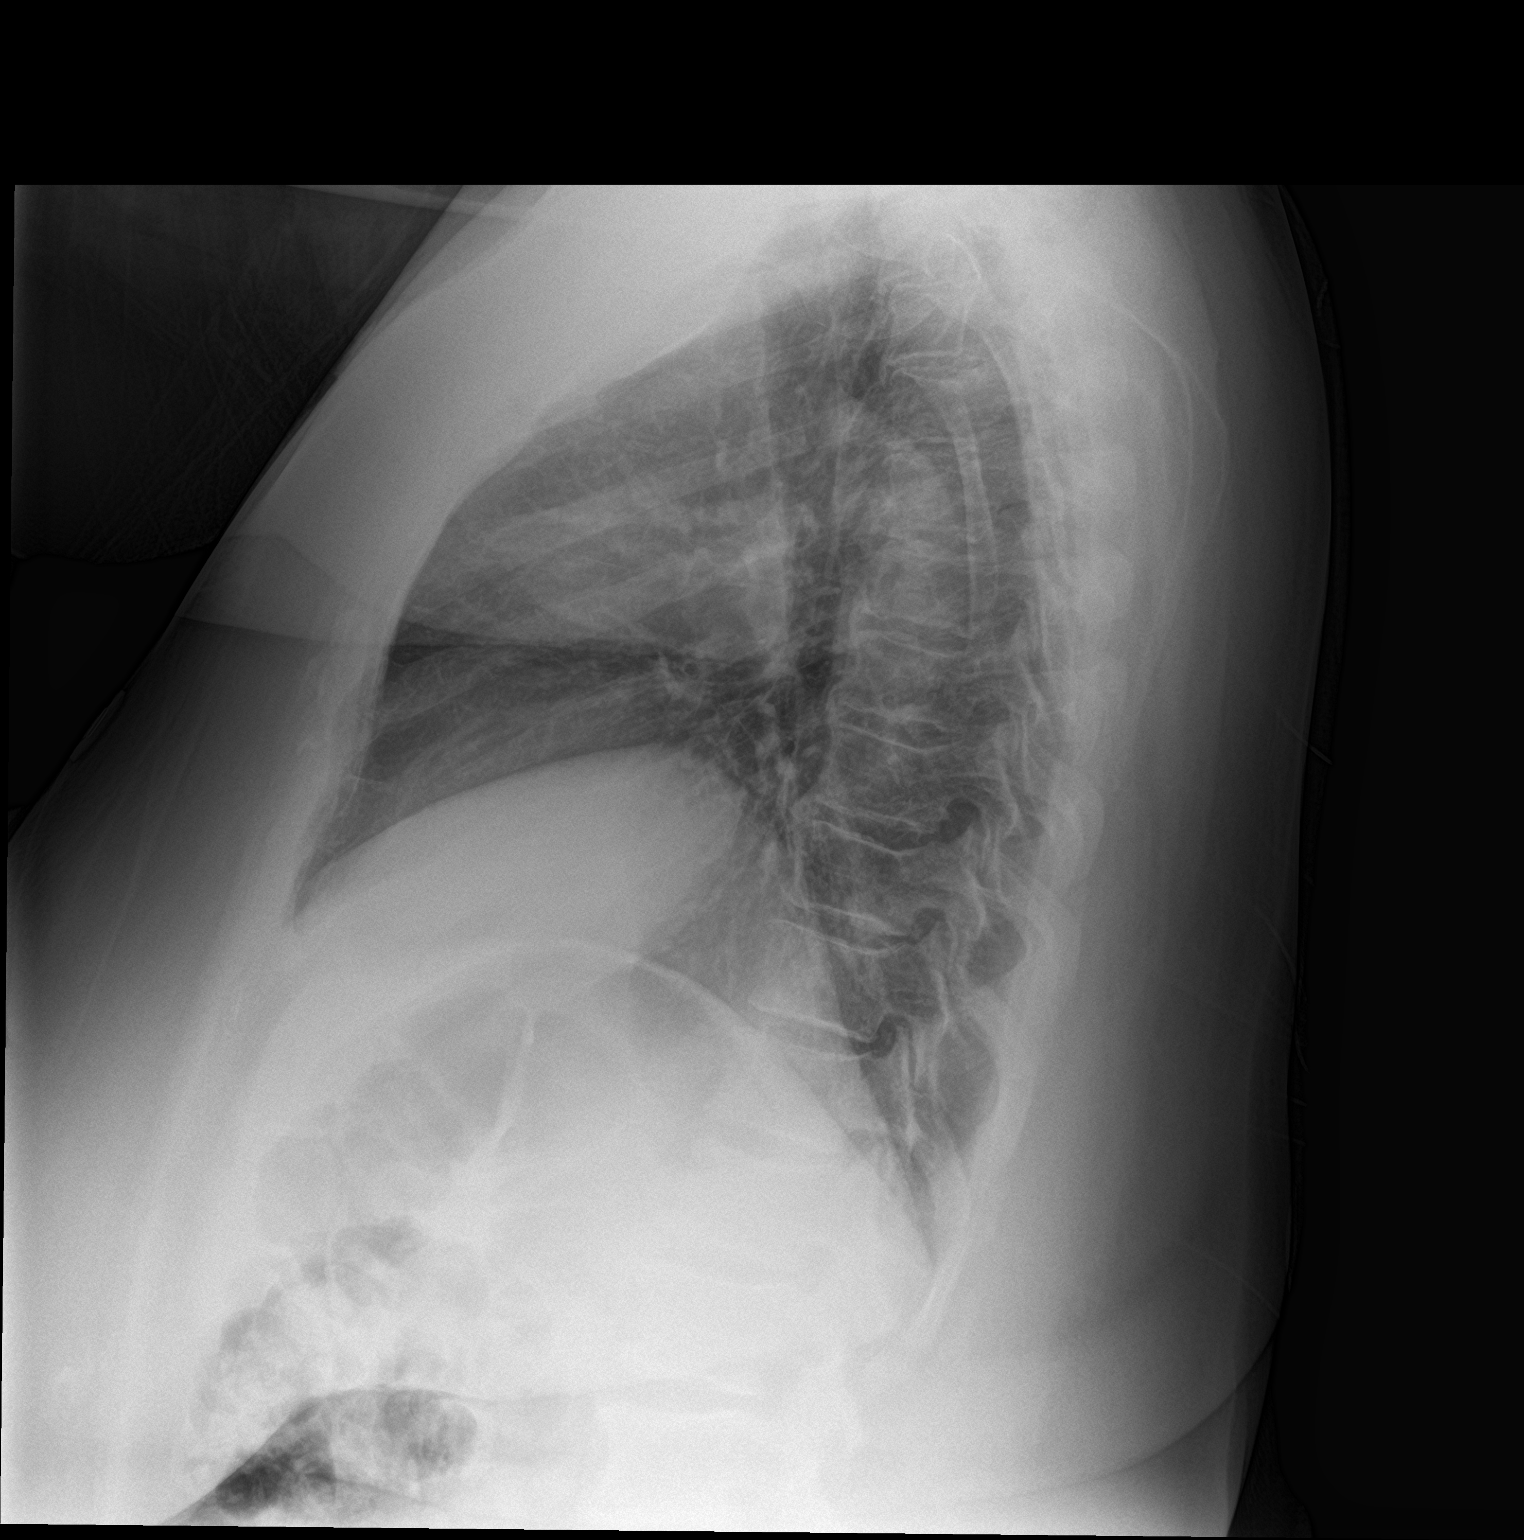

[2 of 2 positions shown; findings below may reference images not displayed]

FINDINGS: The heart size and mediastinal contours are within normal limits.
Both lungs are clear. The visualized skeletal structures are
unremarkable.
IMPRESSION: No active cardiopulmonary disease.
